# Patient Record
Sex: Female | Born: 2000 | Race: Black or African American | Hispanic: No | Marital: Single | Smoking: Never smoker
Health system: Southern US, Community
[De-identification: ages and names within clinical notes are randomized; demographics above are authoritative.]

## PROBLEM LIST (undated history)

## (undated) DIAGNOSIS — F909 Attention-deficit hyperactivity disorder, unspecified type: Secondary | ICD-10-CM

## (undated) DIAGNOSIS — K639 Disease of intestine, unspecified: Secondary | ICD-10-CM

## (undated) DIAGNOSIS — K5900 Constipation, unspecified: Secondary | ICD-10-CM

## (undated) DIAGNOSIS — L309 Dermatitis, unspecified: Secondary | ICD-10-CM

## (undated) HISTORY — PX: TYMPANOSTOMY TUBE PLACEMENT: SHX32

## (undated) HISTORY — PX: ABDOMINAL SURGERY: SHX537

## (undated) HISTORY — PX: ADENOIDECTOMY: SUR15

## (undated) HISTORY — DX: Dermatitis, unspecified: L30.9

## (undated) HISTORY — PX: TONSILLECTOMY: SUR1361

---
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
## 2020-09-28 ENCOUNTER — Encounter (HOSPITAL_BASED_OUTPATIENT_CLINIC_OR_DEPARTMENT_OTHER): Payer: Self-pay | Admitting: *Deleted

## 2020-09-28 ENCOUNTER — Emergency Department (HOSPITAL_BASED_OUTPATIENT_CLINIC_OR_DEPARTMENT_OTHER)
Admission: EM | Admit: 2020-09-28 | Discharge: 2020-09-29 | Disposition: A | Discharge status: Home / Self Care | Payer: BC Managed Care – PPO | Attending: Emergency Medicine | Admitting: Emergency Medicine

## 2020-09-28 ENCOUNTER — Other Ambulatory Visit: Payer: Self-pay

## 2020-09-28 DIAGNOSIS — Z20822 Contact with and (suspected) exposure to covid-19: Secondary | ICD-10-CM | POA: Diagnosis not present

## 2020-09-28 DIAGNOSIS — Z91010 Allergy to peanuts: Secondary | ICD-10-CM | POA: Insufficient documentation

## 2020-09-28 DIAGNOSIS — Z28310 Unvaccinated for covid-19: Secondary | ICD-10-CM | POA: Insufficient documentation

## 2020-09-28 DIAGNOSIS — M940 Chondrocostal junction syndrome [Tietze]: Secondary | ICD-10-CM | POA: Insufficient documentation

## 2020-09-28 DIAGNOSIS — J209 Acute bronchitis, unspecified: Secondary | ICD-10-CM | POA: Diagnosis not present

## 2020-09-28 DIAGNOSIS — R059 Cough, unspecified: Secondary | ICD-10-CM | POA: Diagnosis present

## 2020-09-28 DIAGNOSIS — B349 Viral infection, unspecified: Secondary | ICD-10-CM | POA: Diagnosis not present

## 2020-09-28 NOTE — ED Triage Notes (Signed)
C/o uri symptoms , nasal/ chest congestion ,pro cough  x 2 days

## 2020-09-28 NOTE — ED Provider Notes (Signed)
[facility] EMERGENCY DEPARTMENT Provider Note   CSN: [id number] Arrival date & time: 09/28/20  2204     History Chief Complaint  Patient presents with   URI    Brandi Boyd is a 20 y.o. female with no relevant past medical history presents the ED with a 2-day history of cold symptoms.  On my examination, she is accompanied by her mother at bedside.  Yesterday she felt very fatigued and had sore throat symptoms.  She also endorses significant congestion and had cough productive of greenish sputum at that time.  Today her cough production is clear and her sore throat symptoms have largely improved with conservative management.  However, she has become increasingly short of breath.  She states that she has a significant migraine headache and is having exertional dyspnea.  She lives with her mother and her mother's girlfriend who both been asymptomatic.  She endorses diminished appetite and mild nausea symptoms, as well.  She feels as though her chest is congested, but denies any left-sided chest pain.  She also denies any hemoptysis, unilateral extremity swelling or edema, history of clots or clotting disorder, recent surgeries or immobilization, abdominal pain, illicit drug use, urinary symptoms, or changes in bowel habits.  She denies possibility of pregnancy.  She is not vaccinated for COVID-19.  HPI     Past Medical History:  Diagnosis Date   ADHD (attention deficit hyperactivity disorder)    Colon disorder    Constipated    Eczema     Patient Active Problem List   Diagnosis Date Noted   Anaphylaxis due to food, subsequent encounter 05/22/2017   History of eczema 05/22/2017    Past Surgical History:  Procedure Laterality Date   ABDOMINAL SURGERY     ADENOIDECTOMY     TONSILLECTOMY     TYMPANOSTOMY TUBE PLACEMENT       OB History   No obstetric history on file.     Family History  Problem Relation Age of Onset   Allergic rhinitis Neg Hx     Angioedema Neg Hx    Asthma Neg Hx    Eczema Neg Hx    Immunodeficiency Neg Hx    Urticaria Neg Hx     Social History   Tobacco Use   Smoking status: Never   Smokeless tobacco: Never  Vaping Use   Vaping Use: Never used  Substance Use Topics   Alcohol use: No   Drug use: No    Home Medications Prior to Admission medications   Medication Sig Start Date End Date Taking? Authorizing Provider  dexmethylphenidate (FOCALIN XR) 20 MG 24 hr capsule Take 20 mg by mouth daily.    [provider]  EPINEPHrine (EPIPEN) 0.3 mg/0.3 mL DEVI Inject 0.3 mg into the muscle once.    [provider]  lactulose (KRISTALOSE) 20 G packet Take 20 g by mouth every 14 (fourteen) days. To help stimulate colon    [provider]  lisdexamfetamine (VYVANSE) 30 MG capsule Take 30 mg by mouth every morning.    [provider]    Allergies    Eggs or egg-derived products and Peanut-containing drug products  Review of Systems   Review of Systems  All other systems reviewed and are negative.  Physical Exam Updated Vital Signs BP (!) 132/95 (BP Location: Left Arm)   Pulse 96   Temp 98.7 F (37.1 C) (Oral)   Resp 20   Ht 6' (1.829 m)   Wt 108.9 kg  LMP 09/02/2020   SpO2 99%   BMI 32.55 kg/m   Physical Exam Vitals and nursing note reviewed. Exam conducted with a chaperone present.  Constitutional:      General: She is not in acute distress.    Appearance: Normal appearance. She is ill-appearing. She is not toxic-appearing.  HENT:     Head: Normocephalic and atraumatic.     Nose: Congestion present.     Mouth/Throat:     Pharynx: Oropharynx is clear. No oropharyngeal exudate or posterior oropharyngeal erythema.     Comments: Patent oropharynx.  No uvular deviation.  No tonsil hypertrophy or tenderness noted. Eyes:     General: No scleral icterus.    Conjunctiva/sclera: Conjunctivae normal.  Cardiovascular:     Rate and Rhythm: Normal rate and regular  rhythm.     Pulses: Normal pulses.     Heart sounds: Normal heart sounds.     Comments: Regular rate and rhythm.  No abnormal heart sounds. Pulmonary:     Effort: Pulmonary effort is normal. No respiratory distress.     Breath sounds: Normal breath sounds. No stridor. No wheezing, rhonchi or rales.     Comments: CTA bilaterally.  No increased work of breathing.  No tachypnea.  Speaks in full sentences. Musculoskeletal:        General: Normal range of motion.  Skin:    General: Skin is dry.  Neurological:     General: No focal deficit present.     Mental Status: She is alert and oriented to person, place, and time.     GCS: GCS eye subscore is 4. GCS verbal subscore is 5. GCS motor subscore is 6.     Cranial Nerves: No cranial nerve deficit.  Psychiatric:        Mood and Affect: Mood normal.        Behavior: Behavior normal.        Thought Content: Thought content normal.    ED Results / Procedures / Treatments   Labs (all labs ordered are listed, but only abnormal results are displayed) Labs Reviewed  RESP PANEL BY RT-PCR (FLU A&B, COVID) [id number]    EKG EKG Interpretation  Date/Time:  Wednesday September 28 2020 23:41:18 EDT Ventricular Rate:  80 PR Interval:  175 QRS Duration: 78 QT Interval:  365 QTC Calculation: 421 R Axis:   64 Text Interpretation: Sinus rhythm Normal ECG No previous ECGs available Confirmed by Paula Libra ([id number]) on 09/28/2020 11:53:47 PM  Radiology No results found.  Procedures Procedures   Medications Ordered in ED Medications - No data to display  ED Course  I have reviewed the triage vital signs and the nursing notes.  Pertinent labs & imaging results that were available during my care of the patient were reviewed by me and considered in my medical decision making (see chart for details).    MDM Rules/Calculators/A&P                          Loy Boyd was evaluated in Emergency Department on 09/29/2020 for the symptoms  described in the history of present illness. She was evaluated in the context of the global COVID-19 pandemic, which necessitated consideration that the patient might be at risk for infection with the SARS-CoV-2 virus that causes COVID-19. Institutional protocols and algorithms that pertain to the evaluation of patients at risk for COVID-19 are in a state of rapid change based on information released by regulatory bodies including the  CDC and federal and [employer]. These policies and algorithms were followed during the patient's care in the ED.  I personally reviewed patient's medical chart and all notes from triage and staff during today's encounter. I have also ordered and reviewed all labs and imaging that I felt to be medically necessary in the evaluation of this patient's complaints and with consideration of their physical exam. If needed, translation services were available and utilized.   Patient with symptoms consistent with viral illness for 2 days.  We will proceed with COVID-19 and influenza testing.  Given her report of significant dyspnea, particularly with exertion, proceeded with plain films of chest which are personally reviewed and demonstrate no acute cardiopulmonary disease.  Their symptoms are likely of viral etiology and we discussed that antibiotics are not indicated for viral infections.  Patient will be discharged with symptomatic treatment.  Patient is tolerating food and liquid without difficulty and I do not believe that laboratory work-up would yield any significant findings.  Low suspicion for electrolyte derangement, but emphasized the importance of eating regularly.  I also emphasized the importance of rest, continued oral hydration, and antipyretics as needed for fever control.    EKG is reviewed and demonstrates normal sinus rhythm.  No evidence to suggest pericarditis or myocarditis.  Patient was ambulated in the hallway and maintained saturations of 99 to 100% on  room air.  Heart rate remained in the 90s.  Patient is PERC negative.  PATIENT TESTED POSITIVE FOR RSV.  They were provided opportunity to ask any additional questions and have none at this time.  Prior to discharge patient is feeling well, agreeable with plan for discharge home.  They have expressed understanding of verbal discharge instructions as well as return precautions and are agreeable to the plan.    Final Clinical Impression(s) / ED Diagnoses Final diagnoses:  Viral illness    Rx / DC Orders ED Discharge Orders     None        Lorelee New, PA-C 09/29/20 9024    Vanetta Mulders, MD 09/29/20 1338

## 2020-09-29 ENCOUNTER — Emergency Department (HOSPITAL_BASED_OUTPATIENT_CLINIC_OR_DEPARTMENT_OTHER): Payer: BC Managed Care – PPO

## 2020-09-29 ENCOUNTER — Other Ambulatory Visit: Payer: Self-pay

## 2020-09-29 ENCOUNTER — Emergency Department (HOSPITAL_BASED_OUTPATIENT_CLINIC_OR_DEPARTMENT_OTHER)
Admission: EM | Admit: 2020-09-29 | Discharge: 2020-09-30 | Disposition: A | Discharge status: Home / Self Care | Payer: BC Managed Care – PPO | Source: Home / Self Care | Attending: Emergency Medicine | Admitting: Emergency Medicine

## 2020-09-29 ENCOUNTER — Encounter (HOSPITAL_BASED_OUTPATIENT_CLINIC_OR_DEPARTMENT_OTHER): Payer: Self-pay

## 2020-09-29 DIAGNOSIS — M940 Chondrocostal junction syndrome [Tietze]: Secondary | ICD-10-CM

## 2020-09-29 DIAGNOSIS — Z91010 Allergy to peanuts: Secondary | ICD-10-CM | POA: Insufficient documentation

## 2020-09-29 DIAGNOSIS — J209 Acute bronchitis, unspecified: Secondary | ICD-10-CM

## 2020-09-29 LAB — RESP PANEL BY RT-PCR (FLU A&B, COVID) ARPGX2
Influenza A by PCR: NEGATIVE
Influenza B by PCR: NEGATIVE
SARS Coronavirus 2 by RT PCR: NEGATIVE

## 2020-09-29 MED ORDER — ONDANSETRON 4 MG PO TBDP: 4.0000 mg | ORAL_TABLET | Freq: Three times a day (TID) | ORAL | 0 refills | Status: DC | PRN

## 2020-09-29 MED ORDER — BENZONATATE 100 MG PO CAPS: 100.0000 mg | ORAL_CAPSULE | Freq: Three times a day (TID) | ORAL | 0 refills | Status: DC

## 2020-09-29 NOTE — ED Triage Notes (Signed)
Pt was seen yesterday, dx with RSV. States con't to have shortness of breath. States tonight she developed sharp mid sternal chest pain and bilat leg numbness. Ambulated into triage without difficulty.

## 2020-09-29 NOTE — Discharge Instructions (Addendum)
Your history, physical exam, and work-up is suggestive of viral upper respiratory infection.  You have been tested for COVID-19 and influenza, please maintain isolation precautions pending results of testing.  YOU ARE POSITIVE FOR RSV! Viral communicable illness, everything else applies.  Maintain isolation precautions 3-5 days or until symptoms improved.  Recommend continued conservative management with over-the-counter medications as needed for symptom control.  Most importantly, please continue to check your temperature regularly and take Tylenol or ibuprofen as needed for fever control.  I cannot emphasize enough the importance of increased oral hydration and eating regular meals, even if you are not hungry.  Given your nausea symptoms, I prescribed Zofran which can take as needed.  It dissolves in your mouth.  While your work-up here in the ER was reassuring, it is important that you seek ongoing management if your symptoms fail to improve.  It is also important that you return to the ER seek immediate medical attention should you experience any new or worsening symptoms.

## 2020-09-29 NOTE — ED Notes (Signed)
Ambulated pt in hallway, saturations maintained 99-100% on RA, hr remained in the 90's.

## 2020-09-30 MED ORDER — NAPROXEN 250 MG PO TABS
500.0000 mg | ORAL_TABLET | Freq: Once | ORAL | Status: AC
  Administered 2020-09-30: 500 mg via ORAL
  Filled 2020-09-30: qty 2

## 2020-09-30 MED ORDER — NAPROXEN 375 MG PO TABS: ORAL_TABLET | ORAL | 0 refills | Status: DC

## 2020-09-30 MED ORDER — ALBUTEROL SULFATE HFA 108 (90 BASE) MCG/ACT IN AERS: 2.0000 | INHALATION_SPRAY | RESPIRATORY_TRACT | 0 refills | Status: AC | PRN

## 2020-09-30 NOTE — ED Provider Notes (Signed)
[facility] Provider Note: Brandi Dell, MD, FACEP  CSN: [id number] MRN: [id number] ARRIVAL: 09/29/20 at 2342 ROOM: MH02/MH02   CHIEF COMPLAINT  Chest Pain   HISTORY OF PRESENT ILLNESS  09/30/20 12:34 AM Brandi Boyd is a 20 y.o. female who was seen in the ED 2 days ago for a 2-day history of cold symptoms.  Specifically she was having nasal congestion and productive cough.  She also had a sore throat (which was improving) and shortness of breath.  She was not having chest pain at that time.  The ED note indicates she tested positive for RSV though she was not actually tested for RSV.  This may have been a clinical impression.  She was treated with Jerilynn Som for cough and Zofran for nausea but was not given an albuterol inhaler.  The patient returns with continued shortness of breath, worse with exertion.  She is also having chest pain.  The pain is parasternal and stabbing in nature she rates it as an 8 out of 10.  It is worse with coughing, deep breath, movement and palpation.   Past Medical History:  Diagnosis Date   ADHD (attention deficit hyperactivity disorder)    Colon disorder    Constipated    Eczema     Past Surgical History:  Procedure Laterality Date   ABDOMINAL SURGERY     ADENOIDECTOMY     TONSILLECTOMY     TYMPANOSTOMY TUBE PLACEMENT      Family History  Problem Relation Age of Onset   Allergic rhinitis Neg Hx    Angioedema Neg Hx    Asthma Neg Hx    Eczema Neg Hx    Immunodeficiency Neg Hx    Urticaria Neg Hx     Social History   Tobacco Use   Smoking status: Never   Smokeless tobacco: Never  Vaping Use   Vaping Use: Never used  Substance Use Topics   Alcohol use: No   Drug use: No    Prior to Admission medications   Medication Sig Start Date End Date Taking? Authorizing Provider  albuterol (VENTOLIN HFA) 108 (90 Base) MCG/ACT inhaler Inhale 2 puffs into the lungs every 4 (four) hours as needed for wheezing or  shortness of breath. 09/30/20  Yes Vinessa Macconnell, MD  naproxen (NAPROSYN) 375 MG tablet Take 1 tablet twice daily as needed for chest wall pain. 09/30/20  Yes Evelio Rueda, MD  benzonatate (TESSALON) 100 MG capsule Take 1 capsule (100 mg total) by mouth every 8 (eight) hours. 09/29/20   Lorelee New, PA-C  dexmethylphenidate (FOCALIN XR) 20 MG 24 hr capsule Take 20 mg by mouth daily.    [provider]  EPINEPHrine (EPIPEN) 0.3 mg/0.3 mL DEVI Inject 0.3 mg into the muscle once.    [provider]  lactulose (KRISTALOSE) 20 G packet Take 20 g by mouth every 14 (fourteen) days. To help stimulate colon    [provider]  lisdexamfetamine (VYVANSE) 30 MG capsule Take 30 mg by mouth every morning.    [provider]  ondansetron (ZOFRAN ODT) 4 MG disintegrating tablet Take 1 tablet (4 mg total) by mouth every 8 (eight) hours as needed for nausea or vomiting. 09/29/20   Lorelee New, PA-C    Allergies Eggs or egg-derived products and Peanut-containing drug products   REVIEW OF SYSTEMS  Negative except as noted here or in the History of Present Illness.   PHYSICAL EXAMINATION  Initial Vital Signs Blood pressure  123/86, pulse 71, temperature 98 F (36.7 C), temperature source Oral, resp. rate 16, height 6' (1.829 m), weight 110 kg, last menstrual period 09/02/2020, SpO2 100 %.  Examination General: Well-developed, well-nourished female in no acute distress; appearance consistent with age of record HENT: normocephalic; atraumatic; no pharyngeal erythema or exudate Eyes: pupils equal, round and reactive to light; extraocular muscles intact Neck: supple Heart: regular rate and rhythm Lungs: clear to auscultation bilaterally Chest: Bilateral parasternal tenderness Abdomen: soft; nondistended; nontender; bowel sounds present Extremities: No deformity; full range of motion; pulses normal Neurologic: Awake, alert and oriented; motor function intact in all  extremities and symmetric; no facial droop Skin: Warm and dry Psychiatric: Normal mood and affect   RESULTS  Summary of this visit's results, reviewed and interpreted by myself:   EKG Interpretation  Date/Time:  Thursday September 29 2020 23:53:56 EDT Ventricular Rate:  83 PR Interval:  181 QRS Duration: 84 QT Interval:  374 QTC Calculation: 440 R Axis:   76 Text Interpretation: Sinus rhythm Normal ECG No significant change was found Confirmed by Elric Tirado ([id number]) on 09/30/2020 12:33:38 AM        Laboratory Studies: No results found for this or any previous visit (from the past 24 hour(s)). Imaging Studies: DG Chest Portable 1 View  Result Date: 09/29/2020 CLINICAL DATA:  Shortness of breath EXAM: PORTABLE CHEST 1 VIEW COMPARISON:  None. FINDINGS: Heart and mediastinal contours are within normal limits. No focal opacities or effusions. No acute bony abnormality. IMPRESSION: No active disease. Electronically Signed   By: Charlett Nose M.D.   On: 09/29/2020 00:38    ED COURSE and MDM  Nursing notes, initial and subsequent vitals signs, including pulse oximetry, reviewed and interpreted by myself.  Vitals:   09/29/20 2346 09/29/20 2351 09/30/20 0030  BP: 128/86  123/86  Pulse: 87  71  Resp: 20  16  Temp: 98 F (36.7 C)    TempSrc: Oral    SpO2: 96%  100%  Weight:  110 kg   Height:  6' (1.829 m)    Medications  naproxen (NAPROSYN) tablet 500 mg (has no administration in time range)   The patient's chest pain is consistent with costochondritis and we will treat with naproxen.  We will also provide inhaler for shortness of breath.   PROCEDURES  Procedures   ED DIAGNOSES     ICD-10-CM   1. Costochondritis  M94.0     2. Acute bronchitis with bronchospasm  J20.9          Litisha Guagliardo, Jonny Ruiz, MD 09/30/20 [id number]

## 2022-02-27 IMAGING — DX DG CHEST 1V PORT
1 series · 1 of 1 positions shown · non-contrast
Comparison: None.

CLINICAL DATA: Shortness of breath

EXAM:
PORTABLE CHEST 1 VIEW

[chest ap]
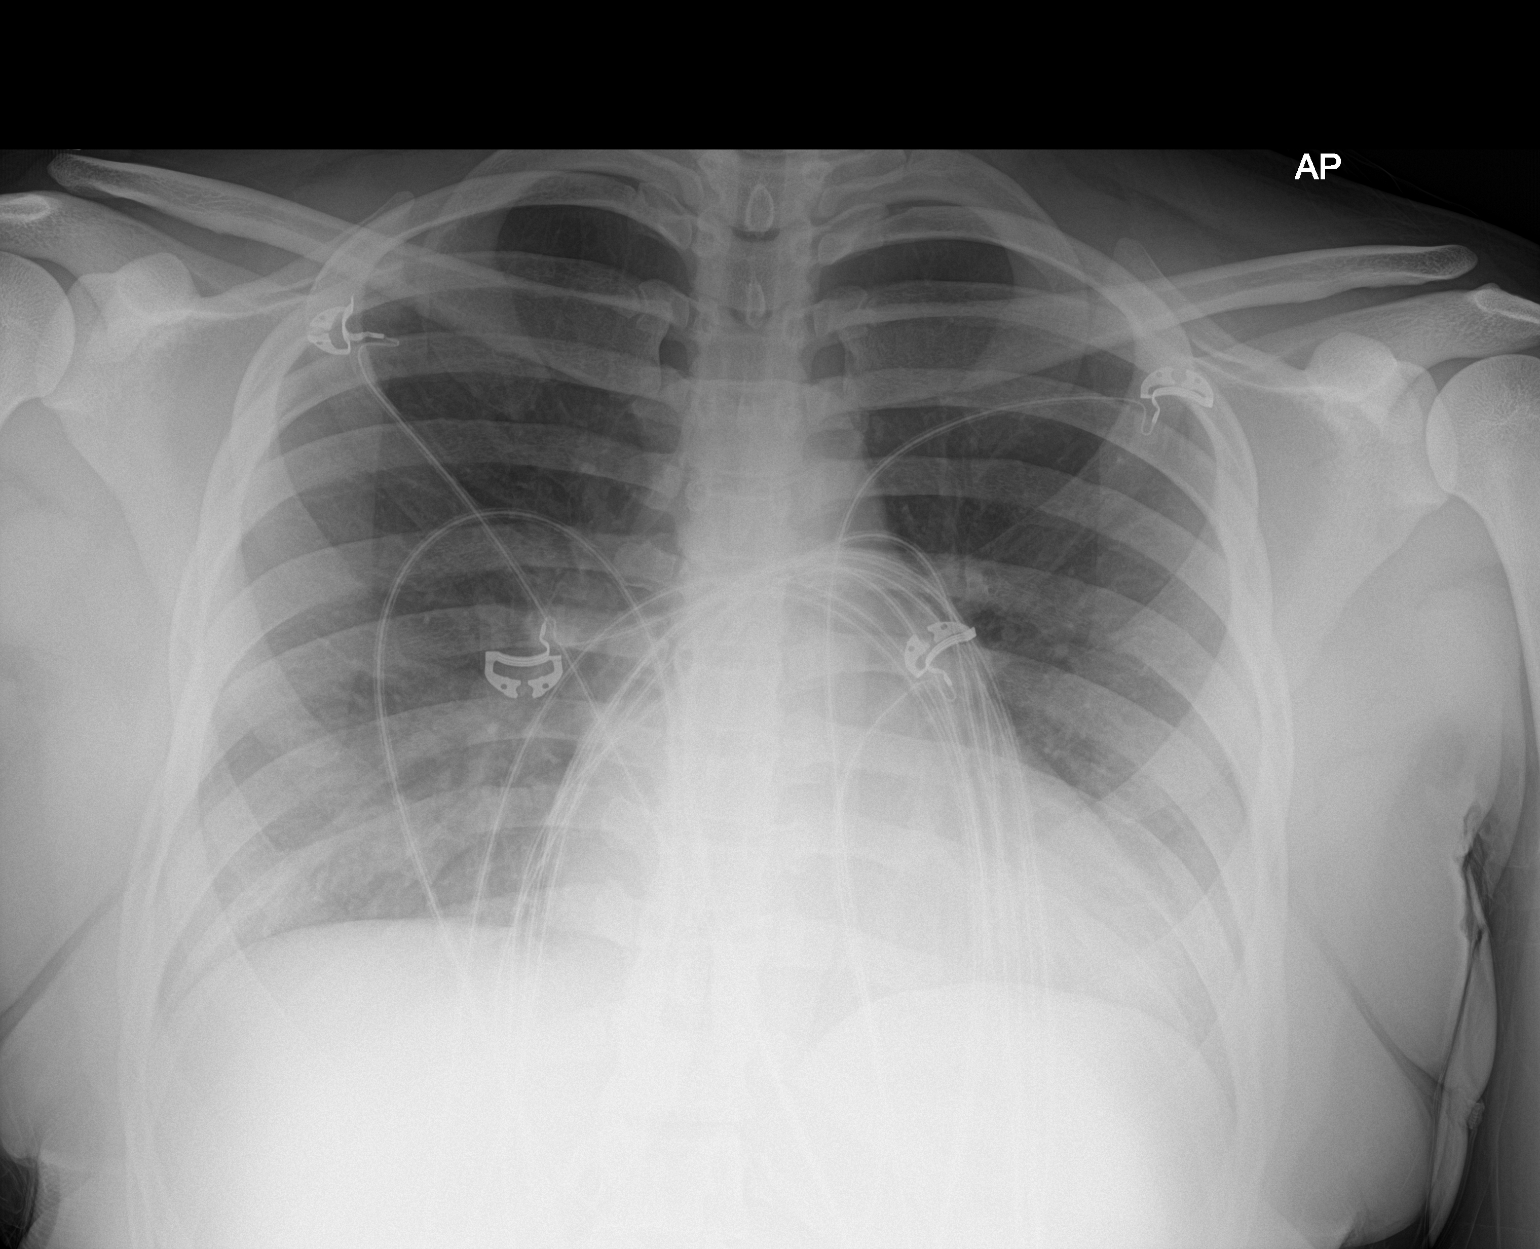

[1 of 1 positions shown; findings below may reference images not displayed]

FINDINGS: Heart and mediastinal contours are within normal limits. No focal
opacities or effusions. No acute bony abnormality.
IMPRESSION: No active disease.

## 2022-10-09 ENCOUNTER — Emergency Department (HOSPITAL_BASED_OUTPATIENT_CLINIC_OR_DEPARTMENT_OTHER)
Admission: EM | Admit: 2022-10-09 | Discharge: 2022-10-10 | Disposition: A | Discharge status: Home / Self Care | Payer: BC Managed Care – PPO | Attending: Emergency Medicine | Admitting: Emergency Medicine

## 2022-10-09 ENCOUNTER — Encounter (HOSPITAL_BASED_OUTPATIENT_CLINIC_OR_DEPARTMENT_OTHER): Payer: Self-pay | Admitting: Emergency Medicine

## 2022-10-09 ENCOUNTER — Emergency Department (HOSPITAL_BASED_OUTPATIENT_CLINIC_OR_DEPARTMENT_OTHER): Payer: BC Managed Care – PPO

## 2022-10-09 DIAGNOSIS — R197 Diarrhea, unspecified: Secondary | ICD-10-CM

## 2022-10-09 DIAGNOSIS — R1084 Generalized abdominal pain: Secondary | ICD-10-CM | POA: Diagnosis present

## 2022-10-09 DIAGNOSIS — A084 Viral intestinal infection, unspecified: Secondary | ICD-10-CM | POA: Diagnosis not present

## 2022-10-09 DIAGNOSIS — Z91010 Allergy to peanuts: Secondary | ICD-10-CM | POA: Diagnosis not present

## 2022-10-09 LAB — URINALYSIS, ROUTINE W REFLEX MICROSCOPIC
Glucose, UA: NEGATIVE mg/dL
Hgb urine dipstick: NEGATIVE
Ketones, ur: NEGATIVE mg/dL
Leukocytes,Ua: NEGATIVE
Nitrite: NEGATIVE
Protein, ur: 30 mg/dL — AB
Specific Gravity, Urine: >=1.03 (ref 1.005–1.030)
pH: 5.5 (ref 5.0–8.0)

## 2022-10-09 LAB — URINALYSIS, MICROSCOPIC (REFLEX)

## 2022-10-09 LAB — COMPREHENSIVE METABOLIC PANEL
ALT: 23 U/L (ref 0–44)
AST: 18 U/L (ref 15–41)
Albumin: 4 g/dL (ref 3.5–5.0)
Alkaline Phosphatase: 60 U/L (ref 38–126)
Anion gap: 7 (ref 5–15)
BUN: 10 mg/dL (ref 6–20)
CO2: 23 mmol/L (ref 22–32)
Calcium: 8.5 mg/dL — ABNORMAL LOW (ref 8.9–10.3)
Chloride: 104 mmol/L (ref 98–111)
Creatinine, Ser: 0.89 mg/dL (ref 0.44–1.00)
GFR, Estimated: >60 mL/min (ref >60)
Glucose, Bld: 104 mg/dL — ABNORMAL HIGH (ref 70–99)
Potassium: 3.2 mmol/L — ABNORMAL LOW (ref 3.5–5.1)
Sodium: 134 mmol/L — ABNORMAL LOW (ref 135–145)
Total Bilirubin: 0.5 mg/dL (ref 0.3–1.2)
Total Protein: 7.3 g/dL (ref 6.5–8.1)

## 2022-10-09 LAB — LIPASE, BLOOD: Lipase: 25 U/L (ref 11–51)

## 2022-10-09 LAB — CBC
HCT: 41.1 % (ref 36.0–46.0)
Hemoglobin: 13.5 g/dL (ref 12.0–15.0)
MCH: 27.4 pg (ref 26.0–34.0)
MCHC: 32.8 g/dL (ref 30.0–36.0)
MCV: 83.4 fL (ref 80.0–100.0)
Platelets: 203 10*3/uL (ref 150–400)
RBC: 4.93 MIL/uL (ref 3.87–5.11)
RDW: 14.4 % (ref 11.5–15.5)
WBC: 6.4 10*3/uL (ref 4.0–10.5)
nRBC: 0 % (ref 0.0–0.2)

## 2022-10-09 LAB — PREGNANCY, URINE: Preg Test, Ur: NEGATIVE

## 2022-10-09 MED ORDER — ONDANSETRON 4 MG PO TBDP
4.0000 mg | ORAL_TABLET | Freq: Once | ORAL | Status: AC | PRN
  Administered 2022-10-09: 4 mg via ORAL
  Filled 2022-10-09: qty 1

## 2022-10-09 MED ORDER — IOHEXOL 300 MG/ML  SOLN
125.0000 mL | Freq: Once | INTRAMUSCULAR | Status: AC | PRN
  Administered 2022-10-09: 125 mL via INTRAVENOUS

## 2022-10-09 MED ORDER — OXYCODONE-ACETAMINOPHEN 5-325 MG PO TABS
1.0000 | ORAL_TABLET | ORAL | Status: DC | PRN
  Administered 2022-10-09: 1 via ORAL
  Filled 2022-10-09 (×2): qty 1

## 2022-10-09 MED ORDER — KETOROLAC TROMETHAMINE 15 MG/ML IJ SOLN
15.0000 mg | Freq: Once | INTRAMUSCULAR | Status: AC
  Administered 2022-10-09: 15 mg via INTRAVENOUS
  Filled 2022-10-09: qty 1

## 2022-10-09 NOTE — ED Triage Notes (Signed)
Abdominal pain since 3a, n/d since 6a. Tried OTC meds with no relief and went to UC and was told to go to ED.

## 2022-10-10 NOTE — ED Provider Notes (Signed)
[place] [facility] Provider Note   CSN: [id number] Arrival date & time: 10/09/22  2011     History  Chief Complaint  Patient presents with   Abdominal Pain    Brandi Boyd is a 22 y.o. female.  The history is provided by the patient and a parent.   Patient reports she woke up around 3 AM on June 25 with profuse nonbloody diarrhea.  She has had up to 7 episodes of diarrhea.  She reports fever and chills and nausea without vomiting.  She reports diffuse abdominal pain.  She went to an urgent care and was told to go to the ER for further evaluation No previous abdominal surgery no recent travel or camping.  No sick contacts She is otherwise relatively healthy at baseline   Past Medical History:  Diagnosis Date   ADHD (attention deficit hyperactivity disorder)    Colon disorder    Constipated    Eczema     Home Medications Prior to Admission medications   Medication Sig Start Date End Date Taking? Authorizing Provider  albuterol (VENTOLIN HFA) 108 (90 Base) MCG/ACT inhaler Inhale 2 puffs into the lungs every 4 (four) hours as needed for wheezing or shortness of breath. 09/30/20   Molpus, John, MD  dexmethylphenidate (FOCALIN XR) 20 MG 24 hr capsule Take 20 mg by mouth daily.    [provider]  EPINEPHrine (EPIPEN) 0.3 mg/0.3 mL DEVI Inject 0.3 mg into the muscle once.    [provider]  lactulose (KRISTALOSE) 20 G packet Take 20 g by mouth every 14 (fourteen) days. To help stimulate colon    [provider]  lisdexamfetamine (VYVANSE) 30 MG capsule Take 30 mg by mouth every morning.    [provider]      Allergies    Penicillins, Egg-derived products, and Peanut-containing drug products    Review of Systems   Review of Systems  Constitutional:  Positive for chills and fever.  Gastrointestinal:  Positive for abdominal pain, diarrhea and nausea. Negative for blood in stool and vomiting.     Physical Exam Updated Vital Signs BP 122/73 (BP Location: Right Arm)   Pulse (!) 105   Temp 99.5 F (37.5 C) (Oral)   Resp 15   Ht 1.854 m (6\' 1" )   Wt 107.5 kg   LMP  (LMP Unknown)   SpO2 98%   BMI 31.27 kg/m  Physical Exam CONSTITUTIONAL: Well developed/well nourished HEAD: Normocephalic/atraumatic EYES: EOMI/PERRL ENMT: Mucous membranes moist NECK: supple no meningeal signs SPINE/BACK:entire spine nontender CV: S1/S2 noted, no murmurs/rubs/gallops noted LUNGS: Lungs are clear to auscultation bilaterally, no apparent distress ABDOMEN: soft, nontender, no rebound or guarding, bowel sounds noted throughout abdomen GU:no cva tenderness NEURO: Pt is awake/alert/appropriate, moves all extremitiesx4.  No facial droop.   EXTREMITIES: pulses normal/equal, full ROM SKIN: warm, color normal PSYCH: no abnormalities of mood noted, alert and oriented to situation  ED Results / Procedures / Treatments   Labs (all labs ordered are listed, but only abnormal results are displayed) Labs Reviewed  COMPREHENSIVE METABOLIC PANEL - Abnormal; Notable for the following components:      Result Value   Sodium 134 (*)    Potassium 3.2 (*)    Glucose, Bld 104 (*)    Calcium 8.5 (*)    All other components within normal limits  URINALYSIS, ROUTINE W REFLEX MICROSCOPIC - Abnormal; Notable for the following components:   APPearance CLOUDY (*)    Bilirubin  Urine SMALL (*)    Protein, ur 30 (*)    All other components within normal limits  URINALYSIS, MICROSCOPIC (REFLEX) - Abnormal; Notable for the following components:   Bacteria, UA FEW (*)    All other components within normal limits  LIPASE, BLOOD  CBC  PREGNANCY, URINE    EKG None  Radiology CT ABDOMEN PELVIS W CONTRAST  Result Date: 10/09/2022 CLINICAL DATA:  Abdominal pain. EXAM: CT ABDOMEN AND PELVIS WITH CONTRAST TECHNIQUE: Multidetector CT imaging of the abdomen and pelvis was performed using the standard protocol  following bolus administration of intravenous contrast. RADIATION DOSE REDUCTION: This exam was performed according to the departmental dose-optimization program which includes automated exposure control, adjustment of the mA and/or kV according to patient size and/or use of iterative reconstruction technique. CONTRAST:  OMNIPAQUE IOHEXOL 300 MG/ML  SOLN COMPARISON:  None Available. FINDINGS: Lower chest: No acute abnormality. Hepatobiliary: No focal liver abnormality is seen. No gallstones, gallbladder wall thickening, or biliary dilatation. Pancreas: Unremarkable. No pancreatic ductal dilatation or surrounding inflammatory changes. Spleen: Normal in size without focal abnormality. Adrenals/Urinary Tract: Adrenal glands are unremarkable. Kidneys are normal, without obstructing renal calculi, focal lesion, or hydronephrosis. Multiple bilateral 2 mm and 3 mm nonobstructing renal calculi are seen. The urinary bladder is empty and subsequently limited in evaluation. Stomach/Bowel: Stomach is within normal limits. Appendix appears normal. No evidence of bowel wall thickening, distention, or inflammatory changes. Vascular/Lymphatic: Aortic atherosclerosis. Multiple subcentimeter para-aortic and aortocaval lymph nodes are seen. Numerous subcentimeter mesenteric lymph nodes are also noted. Reproductive: Uterus and bilateral adnexa are unremarkable. Other: No abdominal wall hernia or abnormality. No abdominopelvic ascites. Musculoskeletal: No acute or significant osseous findings. IMPRESSION: 1. Multiple bilateral 2 mm and 3 mm nonobstructing renal calculi. 2. Aortic atherosclerosis. Aortic Atherosclerosis (ICD10-I70.0). Electronically Signed   By: Aram Candela M.D.   On: 10/09/2022 22:35    Procedures Procedures    Medications Ordered in ED Medications  oxyCODONE-acetaminophen (PERCOCET/ROXICET) 5-325 MG per tablet 1 tablet (1 tablet Oral Given 10/09/22 2054)  ondansetron (ZOFRAN-ODT) disintegrating  tablet 4 mg (4 mg Oral Given 10/09/22 2054)  iohexol (OMNIPAQUE) 300 MG/ML solution 125 mL (125 mLs Intravenous Contrast Given 10/09/22 2227)  ketorolac (TORADOL) 15 MG/ML injection 15 mg (15 mg Intravenous Given 10/09/22 2320)    ED Course/ Medical Decision Making/ A&P                             Medical Decision Making Amount and/or Complexity of Data Reviewed Labs: ordered.  Risk Prescription drug management.   This patient presents to the ED for concern of abdominal pain, this involves an extensive number of treatment options, and is a complaint that carries with it a high risk of complications and morbidity.  The differential diagnosis includes but is not limited to cholecystitis, cholelithiasis, pancreatitis, gastritis, peptic ulcer disease, appendicitis, bowel obstruction, bowel perforation, diverticulitis, AAA, ischemic bowel, enteritis, colitis   Additional history obtained: Additional history obtained from family   Lab Tests: Labs reviewed and overall unremarkable  Imaging Studies ordered: I ordered imaging studies including CT scan abdomen pelvis   I independently visualized and interpreted imaging which showed no acute findings I agree with the radiologist interpretation   Medicines ordered and prescription drug management: I ordered medication including Percocet for pain Reevaluation of the patient after these medicines showed that the patient    improved  Reevaluation: After the interventions noted above, I reevaluated  the patient and found that they have :improved  Complexity of problems addressed: Patient's presentation is most consistent with  acute presentation with potential threat to life or bodily function  Disposition: After consideration of the diagnostic results and the patient's response to treatment,  I feel that the patent would benefit from discharge   .    Presumed viral enteritis.  Patient is in no acute distress.  CT imaging negative.   Informed of incidental renal stones.  She is safe for discharge home        Final Clinical Impression(s) / ED Diagnoses Final diagnoses:  Generalized abdominal pain  Diarrhea of presumed infectious origin    Rx / DC Orders ED Discharge Orders     None         Zadie Rhine, MD 10/10/22 0004
